# Patient Record
Sex: Male | Born: 2000 | Race: White | Hispanic: No | Marital: Single | Smoking: Never smoker
Health system: Southern US, Community
[De-identification: ages and names within clinical notes are randomized; demographics above are authoritative.]

## PROBLEM LIST (undated history)

## (undated) DIAGNOSIS — Z808 Family history of malignant neoplasm of other organs or systems: Secondary | ICD-10-CM

## (undated) DIAGNOSIS — Z803 Family history of malignant neoplasm of breast: Secondary | ICD-10-CM

## (undated) HISTORY — DX: Family history of malignant neoplasm of breast: Z80.3

## (undated) HISTORY — PX: TYMPANOSTOMY TUBE PLACEMENT: SHX32

## (undated) HISTORY — DX: Family history of malignant neoplasm of other organs or systems: Z80.8

---
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
## 2024-01-03 ENCOUNTER — Encounter: Payer: Self-pay | Admitting: Genetic Counselor

## 2024-01-03 ENCOUNTER — Telehealth: Payer: Self-pay | Admitting: Genetic Counselor

## 2024-01-03 NOTE — Telephone Encounter (Signed)
 Patient called to setup genetics appt given mother's genetic testing results.  Returned call and LVM.

## 2024-01-03 NOTE — Telephone Encounter (Signed)
 Scheduled patient for telephone genetic counseling visit due to family history of MITF gene mutation in mother (tested at [facility]).  Patient is college student currently located in [place] and preferred virtual visit.  Scheduled 3/11 at 1pm.

## 2024-01-09 ENCOUNTER — Inpatient Hospital Stay: Payer: Self-pay | Attending: Genetic Counselor | Admitting: Genetic Counselor

## 2024-01-09 ENCOUNTER — Encounter: Payer: Self-pay | Admitting: Genetic Counselor

## 2024-01-09 DIAGNOSIS — Z808 Family history of malignant neoplasm of other organs or systems: Secondary | ICD-10-CM

## 2024-01-09 DIAGNOSIS — Z803 Family history of malignant neoplasm of breast: Secondary | ICD-10-CM | POA: Diagnosis not present

## 2024-01-09 NOTE — Progress Notes (Signed)
 REFERRING PROVIDER: Darrow Bussing, MD [address] [place],  [place] [address]  PRIMARY PROVIDER:  Maeola Harman, MD  PRIMARY REASON FOR VISIT:  1. Family history of breast cancer in male   2. Family history of melanoma      HISTORY OF PRESENT ILLNESS:  I connected with  Mr. Esty on 01/09/2024 at 1 PM EDT by telephone conference and verified that I am speaking with the correct person using two identifiers.   Provider location: [facility]   Mr. Santee, a 22 y.o. male, was seen for a [place] cancer genetics consultation as a self referral due to a family history of melanoma in his mother and a known MITF mutation.  Mr. Ring presents to clinic today to discuss the possibility of a hereditary predisposition to cancer, genetic testing, and to further clarify his future cancer risks, as well as potential cancer risks for family members.   Mr. Weisenberger is a 23 y.o. male with no personal history of cancer.    CANCER HISTORY:  Oncology History   No history exists.    Past Medical History:  Diagnosis Date   Family history of breast cancer in male    Family history of melanoma    Family history of melanoma     Past Surgical History:  Procedure Laterality Date   TYMPANOSTOMY TUBE PLACEMENT Bilateral     Social History   Socioeconomic History   Marital status: Single    Spouse name: Not on file   Number of children: Not on file   Years of education: Not on file   Highest education level: Not on file  Occupational History   Not on file  Tobacco Use   Smoking status: Never   Smokeless tobacco: Never  Vaping Use   Vaping status: Never Used  Substance and Sexual Activity   Alcohol use: Never   Drug use: Never   Sexual activity: Never  Other Topics Concern   Not on file  Social History Narrative   Not on file   Social Drivers of Health   Financial Resource Strain: Not on file  Food Insecurity: Not on file  Transportation  Needs: Not on file  Physical Activity: Not on file  Stress: Not on file  Social Connections: Unknown (03/15/2022)   Received from [facility]   Social Network    Social Network: Not on file     FAMILY HISTORY:  We obtained a detailed, 4-generation family history.  Significant diagnoses are listed below: Family History  Problem Relation Age of Onset   Melanoma Mother 89       MITF gene mutation   Endometrial cancer Mother 65   Glaucoma Maternal Grandmother    Diabetes Mellitus II Maternal Grandmother    Obesity Maternal Grandfather    Hypertension Paternal Grandmother    Aneurysm Paternal Grandmother    Breast cancer Paternal Grandfather        breast cancer, lung cancer     The patient does not have children.  He has one sister who is cancer free,  His parents are both living.  The patient's mother was diagnosed with melanoma at 50 and uterine cancer at 46.  She had genetic testing which identified a MITF pathogenic mutation.  There is cancer in more distant relatives to the patient on the maternal side.  The patient's father was diagnosed with melanoma at 34.  He has eight siblings who are all cancer free.  His father had breast  cancer and his mother died of old age.  Mr. Lawless is aware of previous family history of genetic testing for hereditary cancer risks.   GENETIC COUNSELING ASSESSMENT: Mr. Salvia is a 23 y.o. 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 We, therefore, discussed and recommended the following at today's visit.   DISCUSSION: We discussed that, in general, most cancer is not inherited in families, but instead is sporadic or familial. Sporadic cancers occur by chance and typically happen at older ages (>50 years) as this type of cancer is caused by genetic changes acquired during an  individual's lifetime. Some families have more cancers than would be expected by chance; however, the ages or types of cancer are not consistent with a known genetic mutation or known genetic mutations have been ruled out. This type of familial cancer is thought to be due to a combination of multiple genetic, environmental, hormonal, and lifestyle factors. While this combination of factors likely increases the risk of cancer, the exact source of this risk is not currently identifiable or testable.  We discussed that 5 - 10% of cancer is hereditary.  His mother underwent genetic testing based on her diagnosis of two cancers, and additional cancers in more extended relatives.  She was found to carry a MITF mutation. I discussed with the patient that MITF is inherited in an autosomal dominant fashion, and therefore he has a 50% chance of having this same mutation.  MITF mutations have an increased risk for melanoma.  Melanoma most often occurs in the skin, but may also affect the eyes, ears, gastrointestinal tract, and oral and genital membranes. Cutaneous melanoma is considered the most lethal skin cancer if not detected and treated during its early stages.    Additionally, the patient has a paternal family history of male breast cancer and melanoma.  We discussed that it is estimated that 2/3 of men with breast cancer have a hereditary mutation such as BRCA2.  Pathogenic alterations in this gene can increase the risk for breast cancer in both men and women, ovarian cancer, prostate cancer, pancreatic cancer and melanoma.    We reviewed the characteristics, features and inheritance patterns of hereditary cancer syndromes. We also discussed genetic testing, including the appropriate family members to test, the process of testing, insurance coverage and turn-around-time for results. Based on his mother's genetic testing, we could test for the single MITF pathogenic mutation.  If this is performed within 90 days of  her report date then there would be no charge for testing.  Alternatively, we could perform a larger panel test in order to cover the genes associated with male breast cancer and other melanoma genes based on his father's diagnosis.  This larger panel would go through insurance.  We discussed the implications of a negative, positive, carrier and/or variant of uncertain significant result.  Mr. Boissonneault decided to pursue genetic testing for the CancerNext-Expanded+RNAinsight gene panel.   The CancerNext-Expanded gene panel offered by [facility] and includes sequencing, rearrangement, and RNA analysis for the following 76 genes: AIP, ALK, APC, ATM, AXIN2, BAP1, BARD1, BMPR1A, BRCA1, BRCA2, BRIP1, CDC73, CDH1, CDK4, CDKN1B, CDKN2A, CEBPA, CHEK2, CTNNA1, DDX41, DICER1, ETV6, FH, FLCN, GATA2, LZTR1, MAX, MBD4, MEN1, MET, MLH1, MSH2, MSH3, MSH6, MUTYH, NF1, NF2, NTHL1, PALB2, PHOX2B, PMS2, POT1, PRKAR1A, PTCH1, PTEN, RAD51C, RAD51D, RB1, RET, RUNX1, SDHA, SDHAF2, SDHB,  SDHC, SDHD, SMAD4, SMARCA4, SMARCB1, SMARCE1, STK11, SUFU, TMEM127, TP53, TSC1, TSC2, VHL, and WT1 (sequencing and deletion/duplication); EGFR, HOXB13, KIT, MITF, PDGFRA, POLD1, and POLE (sequencing only); EPCAM and GREM1 (deletion/duplication only).    Based on Mr. Sedano's family history of cancer, he meets medical criteria for genetic testing.  Despite that he meets criteria, he may still have an out of pocket cost. We discussed that if his out of pocket cost for testing is over $100, the laboratory will call and confirm whether he wants to proceed with testing.  If the out of pocket cost of testing is less than $100 he will be billed by the genetic testing laboratory.   We discussed that some people do not want to undergo genetic testing due to fear of genetic discrimination.  The Genetic Information Nondiscrimination Act (GINA) was signed into federal law in 2008. GINA prohibits health insurers and most employers from discriminating  against individuals based on genetic information (including the results of genetic tests and family history information). According to GINA, health insurance companies cannot consider genetic information to be a preexisting condition, nor can they use it to make decisions regarding coverage or rates. GINA also makes it illegal for most employers to use genetic information in making decisions about hiring, firing, promotion, or terms of employment. It is important to note that GINA does not offer protections for life insurance, disability insurance, or long-term care insurance. GINA does not apply to those in the [employer], those who work for companies with less than 15 employees, and new life insurance or long-term disability insurance policies.  Health status due to a cancer diagnosis is not protected under GINA. More information about GINA can be found by visiting EliteClients.be.  PLAN: After considering the risks, benefits, and limitations, Mr. Mineau provided informed consent to pursue genetic testing.  He will put off testing until Mar 04, 2024 when a blood sample will be drawn and sent to [facility] for analysis of the CancerNext-Expanded+RNAinsight panel. Results should be available within approximately 2-3 weeks' time, at which point they will be disclosed by telephone to Mr. Quiett, as will any additional recommendations warranted by these results. Mr. Pettet will receive a summary of his genetic counseling visit and a copy of his results once available. This information will also be available in Epic.   Lastly, we encouraged Mr. Blase to remain in contact with cancer genetics annually so that we can continuously update the family history and inform him of any changes in cancer genetics and testing that may be of benefit for this family.   Mr. Pallas's questions were answered to his satisfaction today. Our contact information was provided should additional  questions or concerns arise. Thank you for the referral and allowing [place] to share in the care of your patient.   Marcelene Weidemann P. Lowell Guitar, MS, CGC Licensed, Patent attorney [email or web address] phone: [phone]  60 minutes were spent on the date of the encounter in service to the patient including preparation, face-to-face consultation, documentation and care coordination.  The patient was seen alone.  Drs. Meliton Rattan, and/or P[place] were available for questions, if needed..    _______________________________________________________________________ For Office Staff:  Number of people involved in session: 1 Was an Intern/ student involved with case: no

## 2024-01-18 ENCOUNTER — Telehealth: Payer: Self-pay | Admitting: Genetic Counselor

## 2024-01-18 NOTE — Telephone Encounter (Signed)
 LM on VM that after consideration, we may want to proceed with testing for the MITF gene mutation, and try to get his father in for genetic testing instead.  I asked that he CB and left CB instructions.

## 2024-03-01 ENCOUNTER — Telehealth: Payer: Self-pay | Admitting: Genetic Counselor

## 2024-03-01 ENCOUNTER — Other Ambulatory Visit: Payer: Self-pay | Admitting: Genetic Counselor

## 2024-03-01 DIAGNOSIS — Z808 Family history of malignant neoplasm of other organs or systems: Secondary | ICD-10-CM

## 2024-03-01 NOTE — Telephone Encounter (Signed)
 LM on VM to remind patient of his blood draw scheduled on 5/5 @ 1015.  Left CB instructions if he had questions or needed to r/s.

## 2024-03-04 ENCOUNTER — Inpatient Hospital Stay: Payer: Self-pay | Attending: Genetic Counselor

## 2024-03-04 DIAGNOSIS — Z808 Family history of malignant neoplasm of other organs or systems: Secondary | ICD-10-CM

## 2024-03-04 LAB — GENETIC SCREENING ORDER

## 2024-04-03 ENCOUNTER — Telehealth: Payer: Self-pay | Admitting: Genetic Counselor

## 2024-04-03 NOTE — Telephone Encounter (Signed)
 LM on VM that results are back and to please call.  Left CB instructions.

## 2024-04-04 NOTE — Telephone Encounter (Signed)
 Left 2nd VM that results are back and to please call back.  Left CB instructions.

## 2024-04-08 ENCOUNTER — Telehealth: Payer: Self-pay | Admitting: Genetic Counselor

## 2024-04-08 ENCOUNTER — Ambulatory Visit: Payer: Self-pay | Admitting: Genetic Counselor

## 2024-04-08 DIAGNOSIS — Z1379 Encounter for other screening for genetic and chromosomal anomalies: Secondary | ICD-10-CM | POA: Insufficient documentation

## 2024-04-08 NOTE — Progress Notes (Signed)
 GENETIC TEST RESULTS   Patient Name: Adam Floyd Patient Age: 23 y.o. Encounter Date: 04/08/2024  Referring Provider: Lanae Pinal, MD    Mr. Reamy was seen in the Cancer Genetics clinic due to a family history of cancer and concern regarding a hereditary predisposition to cancer in the family. Please refer to the prior Genetics clinic note for more information regarding Mr. Holan's medical and family histories and our assessment at the time.   FAMILY HISTORY:  We obtained a detailed, 4-generation family history.  Significant diagnoses are listed below: Family History  Problem Relation Age of Onset   Melanoma Mother 37       MITF gene mutation   Endometrial cancer Mother 66   Glaucoma Maternal Grandmother    Diabetes Mellitus II Maternal Grandmother    Obesity Maternal Grandfather    Hypertension Paternal Grandmother    Aneurysm Paternal Grandmother    Breast cancer Paternal Grandfather        breast cancer, lung cancer       The patient does not have children.  He has one sister who is cancer free,  His parents are both living.   The patient's mother was diagnosed with melanoma at 32 and uterine cancer at 81.  She had genetic testing which identified a MITF pathogenic mutation.  There is cancer in more distant relatives to the patient on the maternal side.   The patient's father was diagnosed with melanoma at 27.  He has eight siblings who are all cancer free.  His father had breast cancer and his mother died of old age.   Mr. Bognar is aware of previous family history of genetic testing for hereditary cancer risks.  GENETIC TESTING:  At the time of Mr. Voisin's visit, we recommended he pursue genetic testing of the CancerNext-Expanded+RNAinsight panel. 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 This gene mutation increases your risk to develop melanoma.  Clinical condition Malignant melanoma is a neoplasm, or cancer,  of melanocytes, the cells that produce pigment. Melanoma most often occurs in the skin, but may also affect the eyes, ears, gastrointestinal tract, and oral and genital membranes. Cutaneous melanoma is considered the most lethal skin cancer if not detected and treated during its early stages. Approximately 5-10% of cases are familial (PMID: 40102725). The c.952G>A (p.Glu318Lys) variant in MITF, also known as E318K, is associated with an increased risk of melanoma. Studies suggest the risk may be 5-12% lifetime risk for melanoma. This variant has been associated with features including high nevi count (>200), fair skin, non-blue eye color, and early-onset melanoma (under age 21) . Additionally, there is evidence to suggest this variant may predispose to fast-growing melanomas.  MANAGEMENT:  While there remains lack of a clear consensus on dermatologic management and surveillance guidelines for individuals at increased risk for melanoma, heightened screening may result in early detection and removal of cutaneous lesions at premalignant or early stages, associated with a more favorable prognosis.  There are currently no published medical management guidelines for individuals at increased cancer risk due to a MITF mutation. We expect that medical management guidelines for the MITF gene may become available over time, thus we encouraged Mr. Mattes to contact us  regularly for any updates. While there are no established screening or surveillance guidelines for individuals with the pathogenic E318K variant in MITF, the following recommendations have been suggested:  Regarding melanoma risk management: Follow melanoma prevention programs that include: Sun protection strategies Regular formal dermatologic evaluations Monthly self-examination  of the skin Consider immediate and urgent dermatologic follow-up for any new lesions due to evidence suggesting the E318K variant may be associated with fast-growing melanomas  . Screening of all family members Because data suggests those who test negative for a familial variant may still have an increased risk of developing melanoma (due to other shared and environmental risk factors), such relatives should remain under careful dermatologic surveillance and strict sun protection.  An individual's cancer risk and medical management are not determined by genetic test results alone. Overall cancer risk assessment incorporates additional factors, including personal medical history, family history, and any available genetic information that may result in a personalized plan for cancer prevention and surveillance.   This information is based on current understanding of the gene and may change in the future.   FAMILY MEMBERS: Even though data regarding MITF is preliminary, knowing if a pathogenic variant is present is advantageous. At-risk relatives can be identified, enabling pursuit of a diagnostic evaluation. Further, the available information regarding hereditary cancer susceptibility genes is constantly evolving and more clinically relevant data regarding MITF are likely to become available in the near future. Awareness of this cancer predisposition encourages patients and their providers to inform at-risk family members, to consider implementing proposed screening protocols, and to be vigilant in maintaining close and regular contact with their local genetics clinic in anticipation of new information.It is important that all of Mr. Strothers's maternal relatives (both men and women) know of the presence of this gene mutation. Site-specific genetic testing can sort out who in the family is at risk and who is not.   Based on the paternal family history of melanoma and male breast cancer, we recommend Mr. Schaffer's father undergo genetic testing.  Summary: The information we discussed at the time of Mr. Israelson's appointment and which is summarized in this letter is what  is known as of this date. With the rapid pace of medical research, new discoveries may modify our assessment and approach to his family in the future. We, therefore, encourage Mr. Dhillon's to re-contact us  periodically for information on advances in the field. Mr. Crafts's may also call us  with additional questions or updates regarding his personal and family cancer history.  Our contact number was provided. Mr. Worthington's questions were answered to his satisfaction today, and he knows he is welcome to call anytime with additional questions.   Caidan Hubbert P. [place], MS, [facility] Licensed, Patent attorney [email or web address] phone: [phone]

## 2024-04-08 NOTE — Telephone Encounter (Signed)
 Revealed the positive MITF result.  Testing was otherwise negative.  Based on his father's side of the family we recommend his father to undergo genetic testing.  Discussed increased risk for melanoma and discussed sun protection and medical management with a skin exam by dermatology annually and eye exams.  Discussed that relatives on his maternal side could undergo genetic testing for 90days after his report date.
